# Patient Record
Sex: Female | Born: 2005
Health system: Southern US, Community
[De-identification: ages and names within clinical notes are randomized; demographics above are authoritative.]

## PROBLEM LIST (undated history)

## (undated) DIAGNOSIS — J45909 Unspecified asthma, uncomplicated: Secondary | ICD-10-CM

## (undated) HISTORY — DX: Unspecified asthma, uncomplicated: J45.909

---
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
## 2024-03-02 ENCOUNTER — Other Ambulatory Visit: Payer: Self-pay

## 2024-03-02 ENCOUNTER — Encounter (HOSPITAL_BASED_OUTPATIENT_CLINIC_OR_DEPARTMENT_OTHER): Payer: Self-pay

## 2024-03-02 DIAGNOSIS — R197 Diarrhea, unspecified: Secondary | ICD-10-CM | POA: Diagnosis not present

## 2024-03-02 DIAGNOSIS — R112 Nausea with vomiting, unspecified: Secondary | ICD-10-CM | POA: Diagnosis present

## 2024-03-02 NOTE — ED Triage Notes (Addendum)
 Patient reports headache, lack of appetite, and emesis that started today. Patient reports vomiting once.  Patient did not take any meds at home.  Ambulates with steady gait, NAD noted in triage. VSS

## 2024-03-03 ENCOUNTER — Emergency Department (HOSPITAL_BASED_OUTPATIENT_CLINIC_OR_DEPARTMENT_OTHER)
Admission: EM | Admit: 2024-03-03 | Discharge: 2024-03-03 | Disposition: A | Discharge status: Home / Self Care | Attending: Emergency Medicine | Admitting: Emergency Medicine

## 2024-03-03 DIAGNOSIS — R112 Nausea with vomiting, unspecified: Secondary | ICD-10-CM

## 2024-03-03 LAB — RESP PANEL BY RT-PCR (RSV, FLU A&B, COVID)  RVPGX2
Influenza A by PCR: NEGATIVE
Influenza B by PCR: NEGATIVE
Resp Syncytial Virus by PCR: NEGATIVE
SARS Coronavirus 2 by RT PCR: NEGATIVE

## 2024-03-03 MED ORDER — ONDANSETRON HCL 4 MG PO TABS: 4.0000 mg | ORAL_TABLET | Freq: Four times a day (QID) | ORAL | 0 refills | Status: AC

## 2024-03-03 MED ORDER — ACETAMINOPHEN 500 MG PO TABS
1000.0000 mg | ORAL_TABLET | Freq: Once | ORAL | Status: AC
  Administered 2024-03-03: 1000 mg via ORAL
  Filled 2024-03-03: qty 2

## 2024-03-03 MED ORDER — ONDANSETRON 4 MG PO TBDP
4.0000 mg | ORAL_TABLET | Freq: Once | ORAL | Status: AC
  Administered 2024-03-03: 4 mg via ORAL
  Filled 2024-03-03: qty 1

## 2024-03-03 NOTE — ED Provider Notes (Signed)
 " Krista Mills [facility]MENT AT [facility] Provider Note   CSN: [id number] Arrival date & time: 03/02/24  2150     History Chief Complaint  Patient presents with   Emesis   Headache    HPI2 Krista Mills is a 18 y.o. female presenting for chief complaint of headache and nausea and vomiting. States that her nephew has the flu. Patient's recorded medical, surgical, social, medication list and allergies were reviewed in the Snapshot window as part of the initial history.   Review of Systems   Review of Systems  Constitutional:  Negative for chills and fever.  HENT:  Positive for congestion and sore throat. Negative for ear pain.   Eyes:  Negative for pain and visual disturbance.  Respiratory:  Positive for cough. Negative for choking and shortness of breath.   Cardiovascular:  Negative for chest pain and palpitations.  Gastrointestinal:  Positive for nausea and vomiting. Negative for abdominal pain.  Genitourinary:  Negative for dysuria and hematuria.  Musculoskeletal:  Negative for arthralgias and back pain.  Skin:  Negative for color change and rash.  Neurological:  Positive for headaches. Negative for seizures and syncope.  All other systems reviewed and are negative.   Physical Exam Updated Vital Signs BP 119/85 (BP Location: Right Arm)   Pulse 73   Temp 98 F (36.7 C)   Resp 16   Ht 5' 4 (1.626 m)   Wt 74.8 kg   SpO2 98%   BMI 28.32 kg/m  Physical Exam Vitals and nursing note reviewed.  Constitutional:      General: She is not in acute distress.    Appearance: She is well-developed. She is not ill-appearing or toxic-appearing.  HENT:     Head: Normocephalic and atraumatic.  Eyes:     Extraocular Movements: Extraocular movements intact.     Conjunctiva/sclera: Conjunctivae normal.     Pupils: Pupils are equal, round, and reactive to light.  Cardiovascular:     Rate and Rhythm: Normal rate and regular rhythm.     Heart sounds: No murmur  heard. Pulmonary:     Effort: Pulmonary effort is normal. No respiratory distress.     Breath sounds: Normal breath sounds.  Abdominal:     General: Abdomen is flat. There is no distension.     Palpations: Abdomen is soft.     Tenderness: There is no abdominal tenderness. There is no right CVA tenderness or left CVA tenderness.  Musculoskeletal:        General: No swelling, tenderness, deformity or signs of injury. Normal range of motion.     Cervical back: Normal range of motion and neck supple. No rigidity.  Skin:    General: Skin is warm and dry.  Neurological:     General: No focal deficit present.     Mental Status: She is alert and oriented to person, place, and time. Mental status is at baseline.     Cranial Nerves: No cranial nerve deficit.  Psychiatric:        Mood and Affect: Mood normal.      ED Course/ Medical Decision Making/ A&P    Procedures Procedures   Medications Ordered in ED Medications - No data to display Medical Decision Making:   Krista Mills is a 18 y.o. female who presented to the ED today with subjective fever, cough, congestion detailed above.    Additional history discussed with patient's family/caregivers.  Patient placed on continuous vitals and telemetry monitoring while  in ED which was reviewed periodically.   Complete initial physical exam performed, notably the patient  was hemodynamically stable in no acute distress.  Posterior oropharynx illuminated and without obvious swelling or deformity.  Patient is without neck stiffness.    Reviewed and confirmed nursing documentation for past medical history, family history, social history.    Initial Assessment:   With the patient's presentation of fever cough congestion, most likely diagnosis is developing viral upper respiratory infection. Other diagnoses were considered including (but not limited to) peritonsillar abscess, retropharyngeal abscess, pneumonia. These are considered less likely  due to history of present illness and physical exam findings.   This is most consistent with an acute life/limb threatening illness complicated by underlying chronic conditions. Considered meningitis, however patient's symptoms, vital signs, physical exam findings including lack of meningismus seem grossly less consistent at this time. Initial Plan:   Viral screening including COVID/flu testing to evaluate for common viral etiologies that need to be tracked Empiric treatment with antipyretics including acetaminophen  in ambulatory setting Will augment with dose of zofran   in ED  Objective evaluation as below reviewed   Initial Study Results:   Laboratory  All laboratory results reviewed without evidence of clinically relevant pathology.         Final Assessment and Plan:   On reassessment, patient is ambulatory tolerating p.o. intake in no acute distress.   Patient's COVID test is negative.  Patient is currently stable for outpatient care and management with no indication for hospitalization or transfer at this time.  Discussed all findings with patient expressed understanding.  Disposition:  Based on the above findings, I believe patient is stable for discharge.    Patient/family educated about specific return precautions for given chief complaint and symptoms.  Patient/family educated about follow-up with PCP.     Patient/family expressed understanding of return precautions and need for follow-up. Patient spoken to regarding all imaging and laboratory results and appropriate follow up for these results. All education provided in verbal form with additional information in written form. Time was allowed for answering of patient questions. Patient discharged.    Emergency Department Medication Summary:   Medications  ondansetron  (ZOFRAN -ODT) disintegrating tablet 4 mg (4 mg Oral Given 03/03/24 0101)  acetaminophen  (TYLENOL ) tablet 1,000 mg (1,000 mg Oral Given 03/03/24 0101)            Clinical Impression:  1. Nausea vomiting and diarrhea      Discharge    Clinical Impression: No diagnosis found.   Data Unavailable   Final Clinical Impression(s) / ED Diagnoses Final diagnoses:  None    Rx / DC Orders ED Discharge Orders     None         Jerral Meth, MD 03/03/24 [phone]  "

## 2024-03-03 NOTE — ED Notes (Signed)
Pt. Given PO fluids 

## 2024-03-03 NOTE — ED Notes (Signed)
 Pt. Tolerates PO fluids well. Denies n/v
